# Patient Record
Sex: Male | Born: 1985 | Race: Black or African American | Hispanic: No | Marital: Single | Smoking: Current some day smoker
Health system: Southern US, Community
[De-identification: ages and names within clinical notes are randomized; demographics above are authoritative.]

## PROBLEM LIST (undated history)

## (undated) HISTORY — PX: MOUTH SURGERY: SHX715

---

## 2019-02-14 ENCOUNTER — Encounter (HOSPITAL_COMMUNITY): Payer: Self-pay | Admitting: Radiology

## 2019-02-14 ENCOUNTER — Other Ambulatory Visit: Payer: Self-pay

## 2019-02-14 ENCOUNTER — Emergency Department (HOSPITAL_COMMUNITY)
Admission: EM | Admit: 2019-02-14 | Discharge: 2019-02-14 | Disposition: A | Discharge status: Home / Self Care | Payer: Medicare Other | Attending: Emergency Medicine | Admitting: Emergency Medicine

## 2019-02-14 ENCOUNTER — Emergency Department (HOSPITAL_COMMUNITY): Payer: Medicare Other

## 2019-02-14 DIAGNOSIS — G8918 Other acute postprocedural pain: Secondary | ICD-10-CM

## 2019-02-14 LAB — BASIC METABOLIC PANEL
Anion gap: 10 (ref 5–15)
BUN: 7 mg/dL (ref 6–20)
CO2: 26 mmol/L (ref 22–32)
Calcium: 9.7 mg/dL (ref 8.9–10.3)
Chloride: 99 mmol/L (ref 98–111)
Creatinine, Ser: 0.79 mg/dL (ref 0.61–1.24)
GFR calc Af Amer: >60 mL/min (ref >60)
GFR calc non Af Amer: >60 mL/min (ref >60)
Glucose, Bld: 95 mg/dL (ref 70–99)
Potassium: 4.2 mmol/L (ref 3.5–5.1)
Sodium: 135 mmol/L (ref 135–145)

## 2019-02-14 LAB — CBC WITH DIFFERENTIAL/PLATELET
Abs Immature Granulocytes: 0.02 10*3/uL (ref 0.00–0.07)
Basophils Absolute: 0 10*3/uL (ref 0.0–0.1)
Basophils Relative: 1 %
EOS PCT: 1 %
Eosinophils Absolute: 0.1 10*3/uL (ref 0.0–0.5)
HEMATOCRIT: 40.5 % (ref 39.0–52.0)
Hemoglobin: 13.2 g/dL (ref 13.0–17.0)
Immature Granulocytes: 0 %
Lymphocytes Relative: 19 %
Lymphs Abs: 1 10*3/uL (ref 0.7–4.0)
MCH: 29.7 pg (ref 26.0–34.0)
MCHC: 32.6 g/dL (ref 30.0–36.0)
MCV: 91.2 fL (ref 80.0–100.0)
MONO ABS: 0.6 10*3/uL (ref 0.1–1.0)
Monocytes Relative: 12 %
Neutro Abs: 3.5 10*3/uL (ref 1.7–7.7)
Neutrophils Relative %: 67 %
Platelets: 265 10*3/uL (ref 150–400)
RBC: 4.44 MIL/uL (ref 4.22–5.81)
RDW: 12.9 % (ref 11.5–15.5)
WBC: 5.2 10*3/uL (ref 4.0–10.5)
nRBC: 0 % (ref 0.0–0.2)

## 2019-02-14 MED ORDER — LACTATED RINGERS IV BOLUS
1000.0000 mL | Freq: Once | INTRAVENOUS | Status: AC
  Administered 2019-02-14: 1000 mL via INTRAVENOUS

## 2019-02-14 MED ORDER — MAGIC MOUTHWASH: 5.0000 mL | Freq: Two times a day (BID) | ORAL | 0 refills | Status: AC

## 2019-02-14 MED ORDER — ONDANSETRON HCL 4 MG/2ML IJ SOLN
4.0000 mg | Freq: Once | INTRAMUSCULAR | Status: AC
  Administered 2019-02-14: 4 mg via INTRAVENOUS
  Filled 2019-02-14: qty 2

## 2019-02-14 MED ORDER — IOHEXOL 300 MG/ML  SOLN
75.0000 mL | Freq: Once | INTRAMUSCULAR | Status: AC | PRN
  Administered 2019-02-14: 75 mL via INTRAVENOUS

## 2019-02-14 MED ORDER — MORPHINE SULFATE (PF) 4 MG/ML IV SOLN
4.0000 mg | Freq: Once | INTRAVENOUS | Status: AC
  Administered 2019-02-14: 4 mg via INTRAVENOUS
  Filled 2019-02-14: qty 1

## 2019-02-14 MED ORDER — OXYCODONE HCL 5 MG PO TABS
5.0000 mg | ORAL_TABLET | Freq: Once | ORAL | Status: DC
  Filled 2019-02-14: qty 1

## 2019-02-14 MED ORDER — OXYCODONE-ACETAMINOPHEN 5-325 MG PO TABS: 1.0000 | ORAL_TABLET | ORAL | 0 refills | Status: AC | PRN
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
## 2019-02-14 NOTE — ED Notes (Signed)
Patient could not take pill Oxy. Stated that he can only take liquid by mouth.
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
## 2019-02-14 NOTE — ED Notes (Signed)
Patient verbalized understanding of discharge instructions. Opportunities for questioning and answers were provided. Armband removed by staff. Patient removed from ED.   

## 2019-02-14 NOTE — ED Notes (Signed)
Patient transported to CT 

## 2019-03-23 ENCOUNTER — Other Ambulatory Visit: Payer: Self-pay

## 2019-03-23 ENCOUNTER — Encounter (HOSPITAL_COMMUNITY): Payer: Self-pay | Admitting: *Deleted

## 2019-03-23 ENCOUNTER — Emergency Department (HOSPITAL_COMMUNITY)
Admission: EM | Admit: 2019-03-23 | Discharge: 2019-03-23 | Disposition: A | Discharge status: Home / Self Care | Payer: Medicare Other | Attending: Emergency Medicine | Admitting: Emergency Medicine

## 2019-03-23 DIAGNOSIS — Y929 Unspecified place or not applicable: Secondary | ICD-10-CM | POA: Diagnosis not present

## 2019-03-23 DIAGNOSIS — X58XXXD Exposure to other specified factors, subsequent encounter: Secondary | ICD-10-CM | POA: Diagnosis not present

## 2019-03-23 DIAGNOSIS — Y939 Activity, unspecified: Secondary | ICD-10-CM | POA: Insufficient documentation

## 2019-03-23 DIAGNOSIS — F17200 Nicotine dependence, unspecified, uncomplicated: Secondary | ICD-10-CM | POA: Insufficient documentation

## 2019-03-23 DIAGNOSIS — S02600D Fracture of unspecified part of body of mandible, subsequent encounter for fracture with routine healing: Secondary | ICD-10-CM | POA: Insufficient documentation

## 2019-03-23 DIAGNOSIS — Y999 Unspecified external cause status: Secondary | ICD-10-CM | POA: Diagnosis not present

## 2019-03-23 DIAGNOSIS — Z4889 Encounter for other specified surgical aftercare: Secondary | ICD-10-CM | POA: Diagnosis not present

## 2019-03-23 MED ORDER — BOOST / RESOURCE BREEZE PO LIQD CUSTOM
1.0000 | Freq: Once | ORAL | Status: AC
  Administered 2019-03-23: 1 via ORAL
  Filled 2019-03-23: qty 1
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
## 2019-03-23 NOTE — Discharge Instructions (Addendum)
Call Dr. Jenne Pane office in the morning to arrange a follow-up appointment.

## 2019-03-23 NOTE — ED Notes (Signed)
Pt requested boost, ordered  From pharm

## 2019-03-23 NOTE — ED Provider Notes (Signed)
[facility] EMERGENCY DEPARTMENT Provider Note   CSN: [id number] Arrival date & time: 03/23/19  1400    History   Chief Complaint Chief Complaint  Patient presents with  . Dental Pain    HPI Randy [name] is a 33 y.o. [name].     Patient is a 33 year old [name] with past medical history of recent mandible fracture requiring plating and rubber bands.  This was performed the beginning of March by Dr. Jenne [name] from ENT.  The patient states that he has been unable to make it to the ENT clinic to have his wires and rubber bands removed.  He presents here today requesting this be done here.  Patient has no other complaints other than feeling hungry and requesting a can of Ensure.  The history is provided by the patient.    History reviewed. No pertinent past medical history.  There are no active problems to display for this patient.   Past Surgical History:  Procedure Laterality Date  . MOUTH SURGERY          Home Medications    Prior to Admission medications   Medication Sig Start Date End Date Taking? Authorizing Provider  magic mouthwash SOLN Take 5 mLs by mouth 2 (two) times daily. 02/14/19   Leonette Monarch, MD    Family History No family history on file.  Social History Social History   Tobacco Use  . Smoking status: Current Some Day Smoker  . Smokeless tobacco: Never Used  Substance Use Topics  . Alcohol use: Never    Frequency: Never  . Drug use: Never     Allergies   Patient has no known allergies.   Review of Systems Review of Systems  All other systems reviewed and are negative.    Physical Exam Updated Vital Signs BP 113/81 (BP Location: Left Arm)   Pulse 84   Temp 97.7 F (36.5 C) (Oral)   Resp 18   Ht 6\' 3"  (1.905 m)   Wt 81.6 kg   SpO2 99%   BMI 22.50 kg/m   Physical Exam Vitals signs and nursing note reviewed.  Constitutional:      Appearance: Normal appearance.  HENT:     Head: Normocephalic and  atraumatic.     Mouth/Throat:     Comments: There are several metal plates noted to the upper gums on the front and lower jaws.  There are are rubber bands connecting these metal plates.  Patient has good range of motion.  He has no difficulty breathing and no stridor. Pulmonary:     Effort: Pulmonary effort is normal.  Skin:    General: Skin is warm and dry.  Neurological:     Mental Status: He is alert.      ED Treatments / Results  Labs (all labs ordered are listed, but only abnormal results are displayed) Labs Reviewed - No data to display  EKG None  Radiology No results found.  Procedures Procedures (including critical care time)  Medications Ordered in ED Medications  feeding supplement (BOOST / RESOURCE BREEZE) liquid 1 Container (has no administration in time range)     Initial Impression / Assessment and Plan / ED Course  I have reviewed the triage vital signs and the nursing notes.  Pertinent labs & imaging results that were available during my care of the patient were reviewed by me and considered in my medical decision making (see chart for details).  Patient presented here requesting his metal plates and  rubber bands be removed following his surgery for a mandible fracture.  He tells me he has been unable to follow-up in the office due to various social reasons including living arrangements, lack of transportation, and lack of availability of the ENT office.    I called Dr. Doran [name] from ENT to discuss the situation and possibly make arrangements for him to be seen in clinic.  Dr. Doran [name] informed me that this patient had already called her earlier and she had requested he not go to the ER because we would not be able to accommodate his request.  Apparently, he presented here anyway.  It also sounds as though he called her directly several times from his emergency department exam room, and there may have been some sort of escalation during their conversation.   Patient given the number for the office to call and make arrangements for follow-up.  Final Clinical Impressions(s) / ED Diagnoses   Final diagnoses:  Encounter for postoperative wound care    ED Discharge Orders    None       Geoffery Lyonselo, Khadim Lundberg, MD 03/23/19 1539

## 2019-03-23 NOTE — ED Triage Notes (Signed)
States  He wants the wires cut out of his mouth.

## 2019-03-24 ENCOUNTER — Encounter (HOSPITAL_COMMUNITY): Payer: Self-pay | Admitting: Emergency Medicine

## 2019-03-24 ENCOUNTER — Other Ambulatory Visit: Payer: Self-pay

## 2019-03-24 ENCOUNTER — Emergency Department (HOSPITAL_COMMUNITY)
Admission: EM | Admit: 2019-03-24 | Discharge: 2019-03-25 | Disposition: A | Discharge status: Home / Self Care | Payer: Medicare Other | Source: Home / Self Care | Attending: Emergency Medicine | Admitting: Emergency Medicine

## 2019-03-24 ENCOUNTER — Emergency Department (HOSPITAL_COMMUNITY)
Admission: EM | Admit: 2019-03-24 | Discharge: 2019-03-24 | Disposition: A | Discharge status: Home / Self Care | Payer: Medicare Other | Attending: Emergency Medicine | Admitting: Emergency Medicine

## 2019-03-24 DIAGNOSIS — Z590 Homelessness: Secondary | ICD-10-CM | POA: Diagnosis not present

## 2019-03-24 DIAGNOSIS — F17210 Nicotine dependence, cigarettes, uncomplicated: Secondary | ICD-10-CM | POA: Insufficient documentation

## 2019-03-24 DIAGNOSIS — R531 Weakness: Secondary | ICD-10-CM | POA: Diagnosis not present

## 2019-03-24 DIAGNOSIS — Z79899 Other long term (current) drug therapy: Secondary | ICD-10-CM

## 2019-03-24 DIAGNOSIS — R51 Headache: Principal | ICD-10-CM

## 2019-03-24 DIAGNOSIS — R519 Headache, unspecified: Secondary | ICD-10-CM

## 2019-03-24 DIAGNOSIS — Z8781 Personal history of (healed) traumatic fracture: Secondary | ICD-10-CM | POA: Diagnosis not present

## 2019-03-24 DIAGNOSIS — E860 Dehydration: Secondary | ICD-10-CM

## 2019-03-24 MED ORDER — ENSURE MAX PROTEIN PO LIQD
22.0000 [oz_av] | Freq: Once | ORAL | Status: AC
  Administered 2019-03-24: 22 [oz_av] via ORAL
  Filled 2019-03-24: qty 660

## 2019-03-24 NOTE — ED Triage Notes (Signed)
Pt reports feeling weak and dehydrated. Pt reports this has been going on for 3 days now. Pt reports he is unable to eat as well.
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
## 2019-03-24 NOTE — ED Triage Notes (Signed)
Pt c/o jaw pain and requesting a CT scan. States he had a procedure done by his ENT and it "doesn't feel good".

## 2019-03-25 ENCOUNTER — Encounter (HOSPITAL_COMMUNITY): Payer: Self-pay | Admitting: Emergency Medicine

## 2019-03-25 ENCOUNTER — Other Ambulatory Visit: Payer: Self-pay

## 2019-03-25 NOTE — ED Notes (Signed)
Attempted to discuss discharge paperwork with pt. Pt requesting to see MD, stating he will not be leaving the ED until a CT is performed. MD notified.

## 2019-03-25 NOTE — Discharge Instructions (Addendum)
Follow up with your ENT

## 2019-03-25 NOTE — ED Provider Notes (Signed)
[facility] EMERGENCY DEPARTMENT Provider Note   CSN: [id number] Arrival date & time: 03/24/19  2352    History   Chief Complaint Chief Complaint  Patient presents with  . Jaw Pain    HPI Randy [name] is a 33 y.o. male.     33 yo male presents to the ER demanding CT of his jaw. Patient with recent ORIF 1 month ago while in [place], has been seen multiple times in the ER and with local ENT. Patient was seen yesterday, 03/24/2019 and had the bands removed, states he does not think his jaw is healing correctly and wants a CT done in the ER tonight. Denies drainage, fevers, difficulty swallowing. Patient also requests to have the rest of his hardware removed tonight. No other complaints or concerns.      History reviewed. No pertinent past medical history.  There are no active problems to display for this patient.   Past Surgical History:  Procedure Laterality Date  . MOUTH SURGERY          Home Medications    Prior to Admission medications   Medication Sig Start Date End Date Taking? Authorizing Provider  magic mouthwash SOLN Take 5 mLs by mouth 2 (two) times daily. 02/14/19   Leonette Monarch, MD    Family History History reviewed. No pertinent family history.  Social History Social History   Tobacco Use  . Smoking status: Current Some Day Smoker  . Smokeless tobacco: Never Used  Substance Use Topics  . Alcohol use: Never    Frequency: Never  . Drug use: Never     Allergies   Patient has no known allergies.   Review of Systems Review of Systems  Constitutional: Negative for fever.  HENT: Positive for dental problem. Negative for trouble swallowing and voice change.   Musculoskeletal: Negative for neck pain and neck stiffness.  Skin: Negative for wound.  Allergic/Immunologic: Negative for immunocompromised state.  Hematological: Does not bruise/bleed easily.  Psychiatric/Behavioral: Negative for confusion.  All other systems  reviewed and are negative.    Physical Exam Updated Vital Signs BP 115/74 (BP Location: Left Arm)   Pulse 75   Temp 98.5 F (36.9 C) (Oral)   Resp 16   SpO2 99%   Physical Exam Vitals signs and nursing note reviewed.  Constitutional:      General: He is not in acute distress.    Appearance: He is well-developed. He is not diaphoretic.  HENT:     Head: Normocephalic and atraumatic.     Jaw: Trismus, tenderness and swelling present.      Mouth/Throat:     Mouth: Mucous membranes are moist.  Neck:     Musculoskeletal: Neck supple.  Pulmonary:     Effort: Pulmonary effort is normal.  Lymphadenopathy:     Cervical: No cervical adenopathy.  Skin:    General: Skin is warm and dry.     Findings: No erythema or rash.  Neurological:     Mental Status: He is alert and oriented to person, place, and time.  Psychiatric:        Behavior: Behavior normal.      ED Treatments / Results  Labs (all labs ordered are listed, but only abnormal results are displayed) Labs Reviewed - No data to display  EKG None  Radiology No results found.  Procedures Procedures (including critical care time)  Medications Ordered in ED Medications - No data to display   Initial Impression / Assessment  and Plan / ED Course  I have reviewed the triage vital signs and the nursing notes.  Pertinent labs & imaging results that were available during my care of the patient were reviewed by me and considered in my medical decision making (see chart for details).  Clinical Course as of Mar 24 53  Wed Mar 25, 2019  455 33 year old male with complaint of jaw pain following ORIF 1 month ago in [place].  Patient is followed locally by ENT, seen earlier today and had bands removed with plan for surgical removal of hardware in 2 weeks.  Patient disagrees with plan for surgical removal of his hardware in 2 weeks and would like this done tonight also demands a CT scan to ensure his jaw is healing  correctly.  Patient is afebrile without any drainage or evidence of abscess or infection at this time.  Review of ENT note from today, trismus appears similar to presentation today.  Advised patient we are unable to remove his hardware in the emergency room today and he should follow-up with his ENT, if he has any concerns he can contact the office in the morning when they are open. I do not see an emergent reason to obtain CT tonight, I tried to explain this to the patient who disagrees and states a CT is necessary as he has not had a CT in 1 month and "how do you know I'm healing correctly unless you do a CT?" I again discussed emergent reasons for CT scan, do not feel this test is necessary tonight. Patient argumentative.   [LM]    Clinical Course User Index [LM] Jeannie Fend, PA-C      Final Clinical Impressions(s) / ED Diagnoses   Final diagnoses:  Facial pain    ED Discharge Orders    None       Alden Hipp 03/25/19 0054    Dione Booze, MD 03/25/19 [phone]

## 2019-03-25 NOTE — ED Notes (Signed)
Pt updated on plan of care. Provided with sandwich.

## 2019-07-30 IMAGING — CT CT MAXILLOFACIAL WITH CONTRAST
3 series · 14 of 47 positions shown, 16 images · IV contrast (Omni 300)
Comparison: None.

CLINICAL DATA: 32-year-old male with recent mandible fracture and
jaw wiring 4 days ago in [place]. Facial pain and swelling.

EXAM:
CT MAXILLOFACIAL WITH CONTRAST
TECHNIQUE: Multidetector CT imaging of the maxillofacial structures was
performed with intravenous contrast. Multiplanar CT image
reconstructions were also generated.
CONTRAST:  75mL OMNIPAQUE IOHEXOL 300 MG/ML  SOLN

[Series 3: facialbone 2.0 st · axial · 0.36mm/px · z∈[-183,-3]mm · 8 of 106 slices shown, 10 images]
[im 8/106  brain]
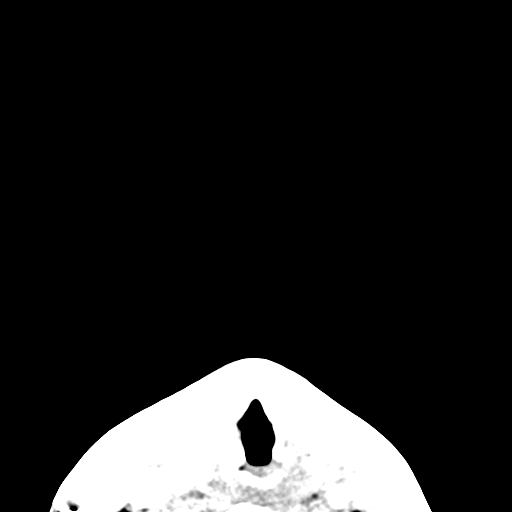
[im 8/106  bone]
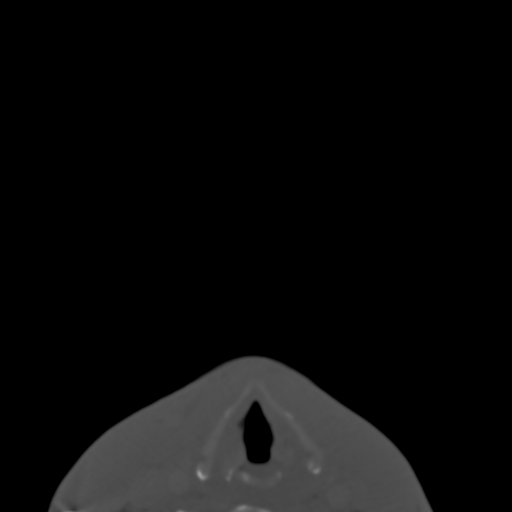
[im 22/106  bone]
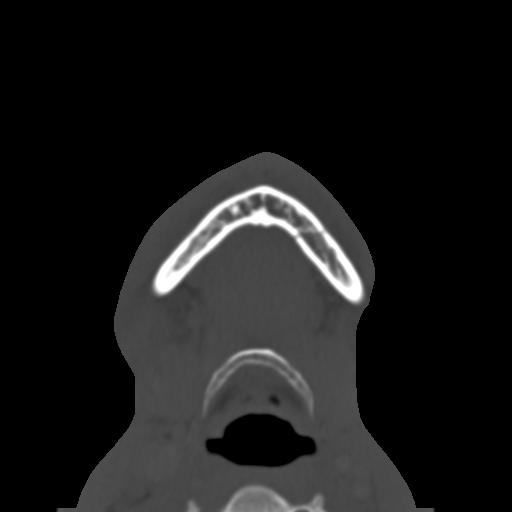
[im 33/106  bone]
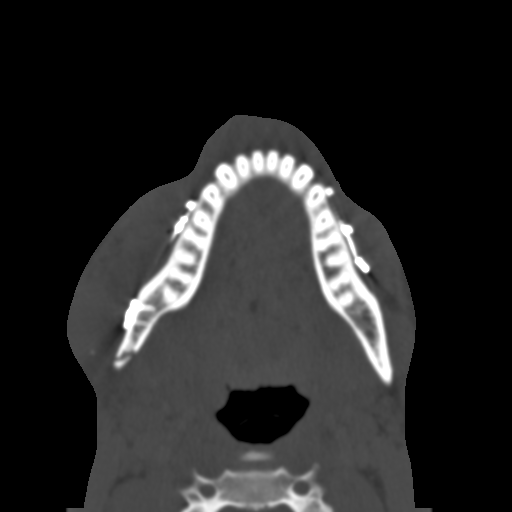
[im 48/106  bone]
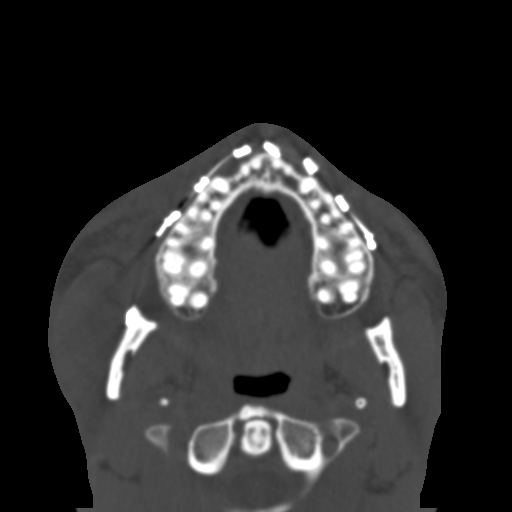
[im 58/106  brain]
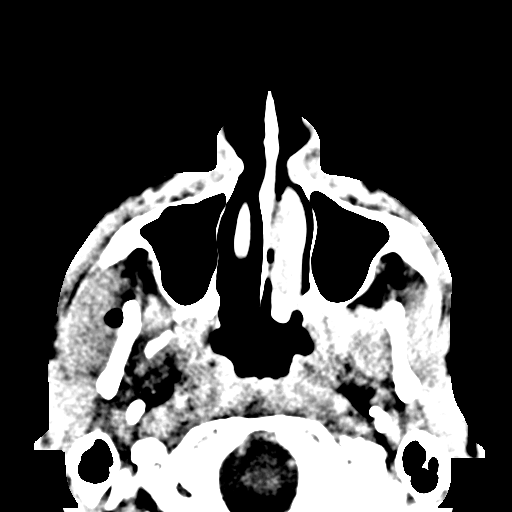
[im 58/106  bone]
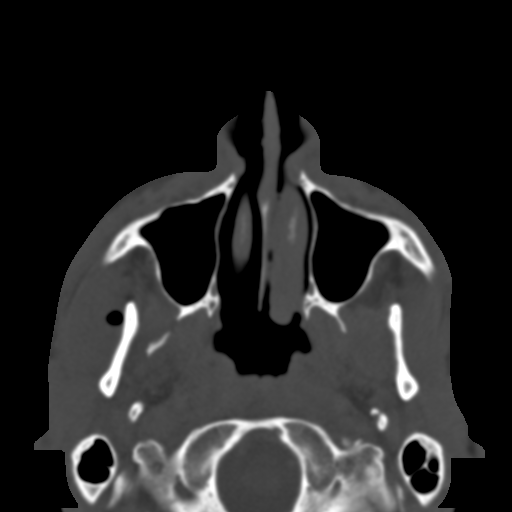
[im 73/106  bone]
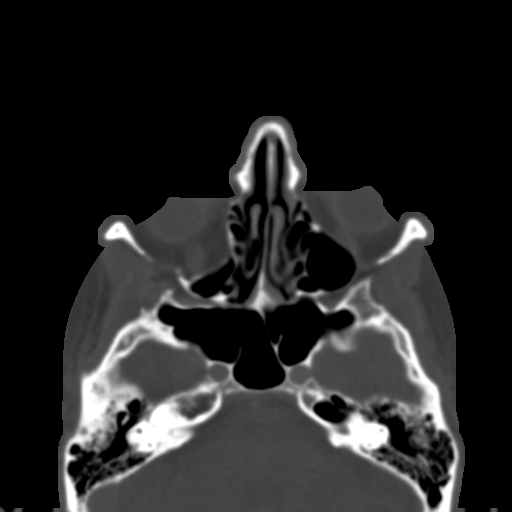
[im 84/106  bone]
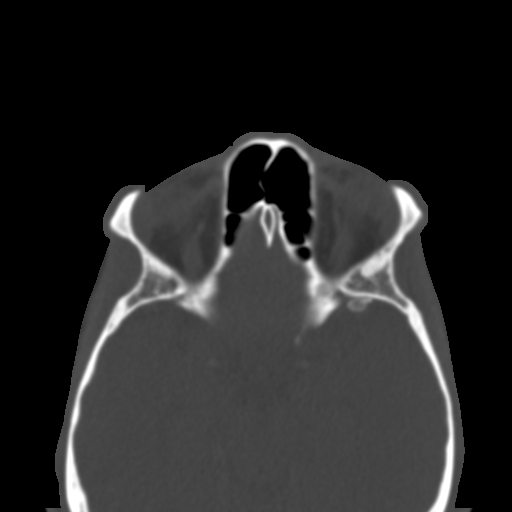
[im 98/106  bone]
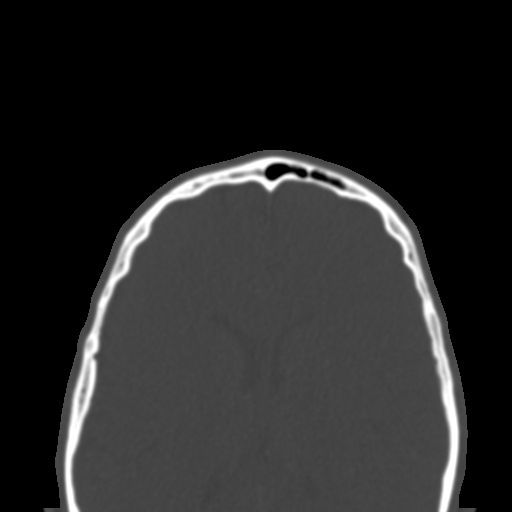

[Series 7: facialbone 2.0 cor st · coronal · 0.37mm/px · 3 of 71 slices shown]
[im 24/71  bone]
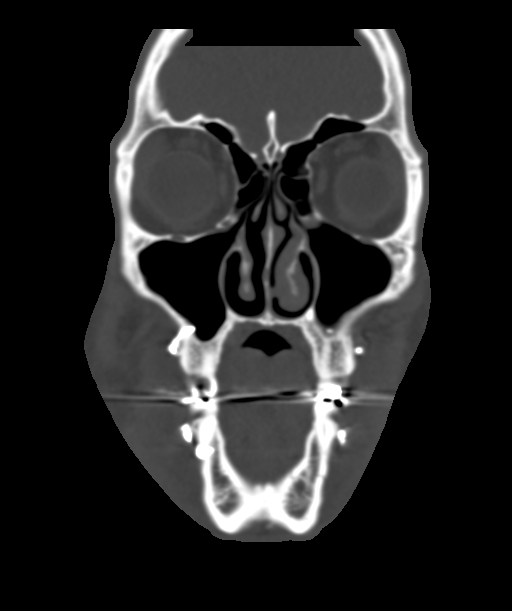
[im 32/71  bone]
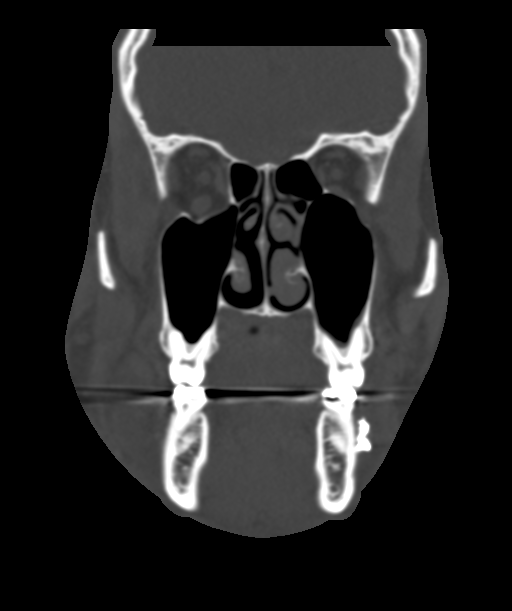
[im 39/71  bone]
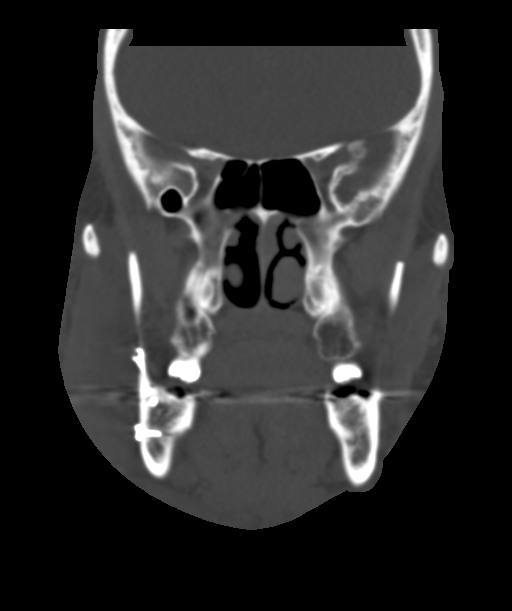

[Series 8: facialbone 2.0 sag st · sagittal · 0.41mm/px · 3 of 83 slices shown]
[im 28/83  bone]
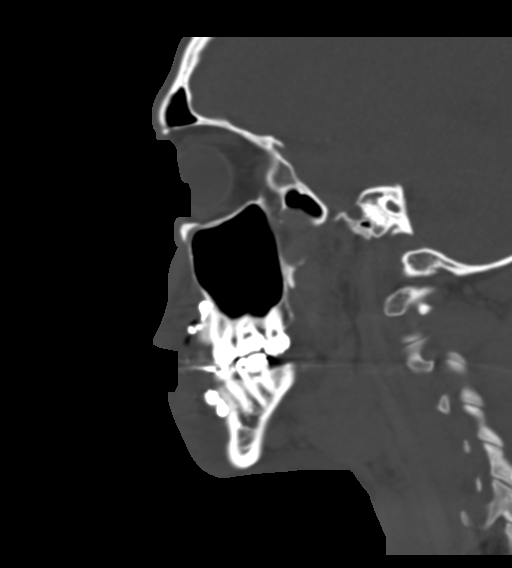
[im 42/83  bone]
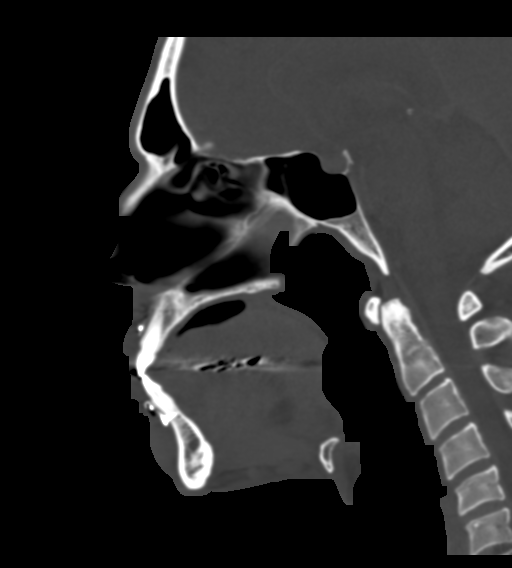
[im 55/83  bone]
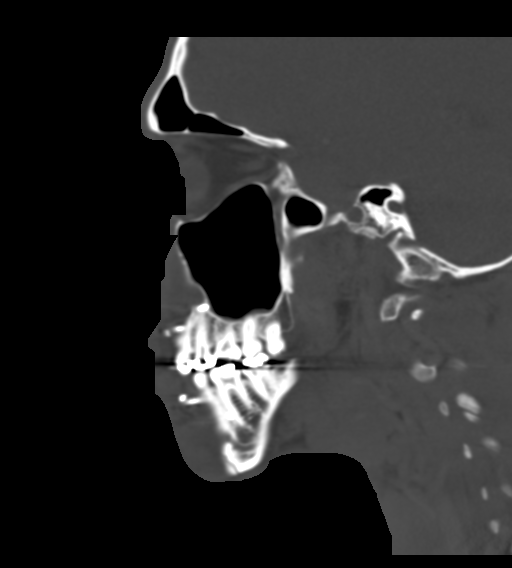

[14 of 47 positions shown; findings below may reference images not displayed]

FINDINGS: Osseous:

There is a linear fracture through the right angle of the mandible
with superimposed ORIF plates (series 6, image 22). No involvement
of the posterior dentition.

Superimposed linear fracture through the body of the left mandible
is nondisplaced (series 4, image 22). The fracture line extends to
the roots of the left mandible canine and anterior bicuspid.

Superimposed bilateral mandible and maxilla hardware anchors for
wiring of the jaw.

All hardware appears intact. No adverse features are identified. The
left angle is intact. Bilateral TMJ are normally aligned.

Mildly displaced fracture of the right lateral pterygoid plate on
series 4, image 58.

No maxilla, zygoma fractures. Possible nondisplaced right nasal bone
fracture.

Central skull base, visible calvarium and cervical spine appear
intact.

Orbits: There is a chronic appearing fracture of the right orbital
floor. No significant herniation of orbital fat. No superimposed
acute orbital wall fracture. Normal orbit soft tissues otherwise.

Sinuses: Paranasal sinuses are clear. Tympanic cavities and mastoids
are clear.

Soft tissues:

Mild hematoma and gas in the right masticator space in the region of
the mandible fracture and ORIF.

There is a tiny bone fragment in the lateral right masticator space
on series 4, image 34.

Superimposed generalized right greater than left facial soft tissue
swelling and stranding. No organized or drainable fluid collection.
No other soft tissue gas.

Negative visible larynx, pharynx, parapharyngeal spaces,
retropharyngeal space and sublingual space. Suboptimal intravascular
contrast bolus. Grossly patent cervical carotid arteries.

Limited intracranial: Negative visualized brain parenchyma.
IMPRESSION: 1. Fractures through the right mandible angle and the left mandible
body.
Superimposed right angle ORIF and bilateral maxilla and mandible
hardware for wiring of the jaw. No adverse hardware features.
2. Questionable recent nondisplaced right nasal bone fracture.
Chronic appearing right orbital floor fracture.
3. Generalize face soft tissue swelling. Right masticator space
hematoma, punctate bone fragment, and trace gas.
No abscess or drainable fluid collection identified.
